# Patient Record
Sex: Female | Born: 1970 | Race: White | Hispanic: No | Smoking: Former smoker
Health system: Southern US, Community
[De-identification: ages and names within clinical notes are randomized; demographics above are authoritative.]

## PROBLEM LIST (undated history)

## (undated) DIAGNOSIS — E119 Type 2 diabetes mellitus without complications: Secondary | ICD-10-CM

## (undated) DIAGNOSIS — E039 Hypothyroidism, unspecified: Secondary | ICD-10-CM

## (undated) DIAGNOSIS — F329 Major depressive disorder, single episode, unspecified: Secondary | ICD-10-CM

## (undated) DIAGNOSIS — J45909 Unspecified asthma, uncomplicated: Secondary | ICD-10-CM

## (undated) DIAGNOSIS — F32A Depression, unspecified: Secondary | ICD-10-CM

## (undated) HISTORY — DX: Depression, unspecified: F32.A

## (undated) HISTORY — DX: Unspecified asthma, uncomplicated: J45.909

## (undated) HISTORY — DX: Hypothyroidism, unspecified: E03.9

## (undated) HISTORY — DX: Major depressive disorder, single episode, unspecified: F32.9

## (undated) HISTORY — DX: Type 2 diabetes mellitus without complications: E11.9

## (undated) HISTORY — PX: NO PAST SURGERIES: SHX2092

---

## 2000-04-10 ENCOUNTER — Ambulatory Visit (HOSPITAL_BASED_OUTPATIENT_CLINIC_OR_DEPARTMENT_OTHER): Admission: RE | Admit: 2000-04-10 | Discharge: 2000-04-10 | Payer: Self-pay | Admitting: Orthopedic Surgery

## 2000-05-22 ENCOUNTER — Ambulatory Visit (HOSPITAL_BASED_OUTPATIENT_CLINIC_OR_DEPARTMENT_OTHER): Admission: RE | Admit: 2000-05-22 | Discharge: 2000-05-22 | Payer: Self-pay | Admitting: Orthopedic Surgery

## 2005-06-22 ENCOUNTER — Ambulatory Visit: Payer: Self-pay | Admitting: Anesthesiology

## 2012-05-08 ENCOUNTER — Other Ambulatory Visit: Payer: Self-pay

## 2012-05-08 ENCOUNTER — Other Ambulatory Visit: Payer: Self-pay | Admitting: General Practice

## 2012-05-08 LAB — COMPREHENSIVE METABOLIC PANEL
Albumin: 3.5 g/dL (ref 3.4–5.0)
Alkaline Phosphatase: 126 U/L (ref 50–136)
Bilirubin,Total: 0.5 mg/dL (ref 0.2–1.0)
Co2: 26 mmol/L (ref 21–32)
Creatinine: 0.68 mg/dL (ref 0.60–1.30)
Glucose: 249 mg/dL — ABNORMAL HIGH (ref 65–99)
Osmolality: 278 (ref 275–301)
Sodium: 135 mmol/L — ABNORMAL LOW (ref 136–145)
Total Protein: 8.1 g/dL (ref 6.4–8.2)

## 2012-05-08 LAB — CBC WITH DIFFERENTIAL/PLATELET
Basophil %: 0.3 %
Eosinophil #: 0.1 10*3/uL (ref 0.0–0.7)
Eosinophil %: 2 %
HGB: 11.6 g/dL — ABNORMAL LOW (ref 12.0–16.0)
Lymphocyte %: 22.3 %
MCH: 26 pg (ref 26.0–34.0)
Neutrophil %: 65.1 %
RBC: 4.45 10*6/uL (ref 3.80–5.20)
WBC: 4.5 10*3/uL (ref 3.6–11.0)

## 2012-08-16 ENCOUNTER — Ambulatory Visit: Payer: Self-pay | Admitting: Hematology and Oncology

## 2012-08-16 LAB — CBC CANCER CENTER
Basophil #: 0 x10 3/mm (ref 0.0–0.1)
Basophil %: 0.4 %
Eosinophil #: 0.1 x10 3/mm (ref 0.0–0.7)
Eosinophil %: 2.1 %
HCT: 36 % (ref 35.0–47.0)
Lymphocyte %: 22.1 %
MCHC: 32.8 g/dL (ref 32.0–36.0)
Monocyte #: 0.4 x10 3/mm (ref 0.2–0.9)
RBC: 4.34 10*6/uL (ref 3.80–5.20)
RDW: 15.2 % — ABNORMAL HIGH (ref 11.5–14.5)
WBC: 4.3 x10 3/mm (ref 3.6–11.0)

## 2012-09-06 ENCOUNTER — Ambulatory Visit: Payer: Self-pay | Admitting: Hematology and Oncology

## 2012-09-06 LAB — CBC CANCER CENTER
Basophil %: 0.3 %
Eosinophil #: 0.1 x10 3/mm (ref 0.0–0.7)
Eosinophil %: 2 %
HCT: 35.1 % (ref 35.0–47.0)
HGB: 11.4 g/dL — ABNORMAL LOW (ref 12.0–16.0)
Lymphocyte %: 20.2 %
MCH: 27.3 pg (ref 26.0–34.0)
MCV: 84 fL (ref 80–100)
Monocyte #: 0.4 x10 3/mm (ref 0.2–0.9)
Neutrophil %: 67.7 %
RBC: 4.18 10*6/uL (ref 3.80–5.20)
WBC: 3.8 x10 3/mm (ref 3.6–11.0)

## 2012-09-06 LAB — TSH: Thyroid Stimulating Horm: 10.8 u[IU]/mL — ABNORMAL HIGH

## 2012-10-06 ENCOUNTER — Ambulatory Visit: Payer: Self-pay | Admitting: Hematology and Oncology

## 2012-10-10 ENCOUNTER — Ambulatory Visit: Payer: Self-pay | Admitting: Obstetrics and Gynecology

## 2012-10-10 LAB — BASIC METABOLIC PANEL
Anion Gap: 7 (ref 7–16)
Calcium, Total: 8.6 mg/dL (ref 8.5–10.1)
Co2: 23 mmol/L (ref 21–32)
EGFR (African American): >60
Osmolality: 281 (ref 275–301)
Sodium: 134 mmol/L — ABNORMAL LOW (ref 136–145)

## 2012-10-10 LAB — CBC
HCT: 34.3 % — ABNORMAL LOW (ref 35.0–47.0)
HGB: 11.8 g/dL — ABNORMAL LOW (ref 12.0–16.0)
MCH: 27.9 pg (ref 26.0–34.0)
MCHC: 34.3 g/dL (ref 32.0–36.0)
MCV: 81 fL (ref 80–100)
Platelet: 94 10*3/uL — ABNORMAL LOW (ref 150–440)
RBC: 4.22 10*6/uL (ref 3.80–5.20)

## 2012-10-14 ENCOUNTER — Ambulatory Visit: Payer: Self-pay | Admitting: Obstetrics and Gynecology

## 2013-04-15 ENCOUNTER — Other Ambulatory Visit: Payer: Self-pay | Admitting: Physician Assistant

## 2013-04-15 LAB — BASIC METABOLIC PANEL
BUN: 9 mg/dL (ref 7–18)
Calcium, Total: 8.5 mg/dL (ref 8.5–10.1)
Co2: 23 mmol/L (ref 21–32)
Creatinine: 0.57 mg/dL — ABNORMAL LOW (ref 0.60–1.30)
EGFR (African American): >60
EGFR (Non-African Amer.): >60
Potassium: 3.7 mmol/L (ref 3.5–5.1)
Sodium: 134 mmol/L — ABNORMAL LOW (ref 136–145)

## 2013-04-15 LAB — CBC WITH DIFFERENTIAL/PLATELET
Basophil #: 0 10*3/uL (ref 0.0–0.1)
Eosinophil #: 0.1 10*3/uL (ref 0.0–0.7)
Eosinophil %: 2.3 %
HCT: 35.4 % (ref 35.0–47.0)
Lymphocyte #: 0.8 10*3/uL — ABNORMAL LOW (ref 1.0–3.6)
MCH: 27.3 pg (ref 26.0–34.0)
Monocyte #: 0.3 x10 3/mm (ref 0.2–0.9)
Monocyte %: 8.3 %
Neutrophil #: 2.6 10*3/uL (ref 1.4–6.5)
Neutrophil %: 67.5 %
Platelet: 90 10*3/uL — ABNORMAL LOW (ref 150–440)
RBC: 4.46 10*6/uL (ref 3.80–5.20)
RDW: 15.7 % — ABNORMAL HIGH (ref 11.5–14.5)

## 2013-04-15 LAB — TSH: Thyroid Stimulating Horm: 13.5 u[IU]/mL — ABNORMAL HIGH

## 2013-07-14 ENCOUNTER — Encounter: Payer: Self-pay | Admitting: Internal Medicine

## 2013-07-14 ENCOUNTER — Ambulatory Visit (INDEPENDENT_AMBULATORY_CARE_PROVIDER_SITE_OTHER): Payer: 59 | Admitting: Internal Medicine

## 2013-07-14 VITALS — BP 110/70 | HR 86 | Temp 98.2°F | Ht 65.25 in | Wt 197.0 lb

## 2013-07-14 DIAGNOSIS — D696 Thrombocytopenia, unspecified: Secondary | ICD-10-CM

## 2013-07-14 DIAGNOSIS — N926 Irregular menstruation, unspecified: Secondary | ICD-10-CM

## 2013-07-14 DIAGNOSIS — E119 Type 2 diabetes mellitus without complications: Secondary | ICD-10-CM

## 2013-07-14 DIAGNOSIS — J45909 Unspecified asthma, uncomplicated: Secondary | ICD-10-CM

## 2013-07-14 DIAGNOSIS — D649 Anemia, unspecified: Secondary | ICD-10-CM

## 2013-07-14 DIAGNOSIS — E039 Hypothyroidism, unspecified: Secondary | ICD-10-CM

## 2013-07-14 DIAGNOSIS — F329 Major depressive disorder, single episode, unspecified: Secondary | ICD-10-CM

## 2013-07-16 ENCOUNTER — Encounter: Payer: Self-pay | Admitting: Internal Medicine

## 2013-07-16 DIAGNOSIS — J45909 Unspecified asthma, uncomplicated: Secondary | ICD-10-CM | POA: Insufficient documentation

## 2013-07-16 DIAGNOSIS — E039 Hypothyroidism, unspecified: Secondary | ICD-10-CM | POA: Insufficient documentation

## 2013-07-16 DIAGNOSIS — D696 Thrombocytopenia, unspecified: Secondary | ICD-10-CM | POA: Insufficient documentation

## 2013-07-16 DIAGNOSIS — D649 Anemia, unspecified: Secondary | ICD-10-CM | POA: Insufficient documentation

## 2013-07-16 DIAGNOSIS — F329 Major depressive disorder, single episode, unspecified: Secondary | ICD-10-CM | POA: Insufficient documentation

## 2013-07-16 DIAGNOSIS — E119 Type 2 diabetes mellitus without complications: Secondary | ICD-10-CM | POA: Insufficient documentation

## 2013-07-16 DIAGNOSIS — N926 Irregular menstruation, unspecified: Secondary | ICD-10-CM | POA: Insufficient documentation

## 2013-07-16 NOTE — Assessment & Plan Note (Signed)
On sertraline.  Stable.  Follow.  

## 2013-07-16 NOTE — Assessment & Plan Note (Signed)
Was followed at the [facility].  Per her report, counts had improved.  Follow.

## 2013-07-16 NOTE — Assessment & Plan Note (Signed)
On levothyroxine.  Follow tsh.  

## 2013-07-16 NOTE — Assessment & Plan Note (Addendum)
On metformin.  Discussed need for diet and exercise.  Check metabolic panel and a1c.  Needs eye exam.  States has never had one.  On losartan for renal protection.

## 2013-07-16 NOTE — Assessment & Plan Note (Signed)
Saw GYN.  S/p ablation.  Doing better.  Continue to follow up with Dr Greggory Keen.

## 2013-07-16 NOTE — Assessment & Plan Note (Signed)
Was followed at the [facility].  Per her report, counts had improved.  Recheck cbc.

## 2013-07-16 NOTE — Progress Notes (Signed)
Subjective:    Patient ID: Diane Burnett, female    DOB: [birth year], 42 y.o.   MRN: [id number]  HPI 42 year old female with past history of diabetes, depression and hypothyroidism who comes in today to follow up on these issues as well as to establish care.  She has recently been followed at [facility]).   Is also seeing Dr Greggory Keen.  Previously saw Dr Bethena Midget [facility]) and Dr Jerral Ralph.  Works in [employer] at [employer].  On metformin for her diabetes.  Has not been watching what she eats.  Not exercising.  Has gained 10-15 pounds in the past year.  Was followed at the [facility] for decreased platelet count and anemia (9-10/13).  Had increased menstrual bleeding.  Is s/p Novasure ablation.  Has fibroids and ovarian cysts.  Now periods are occurring every month.  Counts are better.  Does report some fatigue.  Bowels stable.     Past Medical History  Diagnosis Date  . Asthma   . Depression   . Diabetes mellitus without complication   . Hypothyroidism     Outpatient Encounter Prescriptions as of 07/14/2013  Medication Sig Dispense Refill  . CINNAMON PO Take by mouth.      . cyclobenzaprine (FLEXERIL) 10 MG tablet Take 10 mg by mouth 3 (three) times daily as needed for muscle spasms.      . Fish Oil-Cholecalciferol (FISH OIL + D3 PO) Take by mouth.      . levothyroxine (SYNTHROID, LEVOTHROID) 125 MCG tablet Take 125 mcg by mouth daily before breakfast.      . losartan (COZAAR) 25 MG tablet Take 25 mg by mouth daily.      . meloxicam (MOBIC) 15 MG tablet Take 15 mg by mouth daily.      . metFORMIN (GLUCOPHAGE) 1000 MG tablet Take 1,000 mg by mouth 2 (two) times daily with a meal.      . sertraline (ZOLOFT) 100 MG tablet Take 100 mg by mouth daily.       No facility-administered encounter medications on file as of 07/14/2013.    Review of Systems Patient denies any headache, lightheadedness or dizziness.  No sinus or allergy symptoms.  No chest pain, tightness or  palpitations.  No increased shortness of breath, cough or congestion.  No nausea or vomiting.  No acid reflux.  No abdominal pain or cramping.  No bowel change, such as diarrhea, constipation, BRBPR or melana.  No urine change.   Periods better.  Has a known enlarged thyroid.       Objective:   Physical Exam Filed Vitals:   07/14/13 1433  BP: 110/70  Pulse: 86  Temp: 98.2 F (53.8 C)   42 year old female in no acute distress.   HEENT:  Nares- clear.  Oropharynx - without lesions. NECK:  Supple.  Nontender.  No audible bruit.  HEART:  Appears to be regular. LUNGS:  No crackles or wheezing audible.  Respirations even and unlabored.  RADIAL PULSE:  Equal bilaterally.    BREASTS: performed by gyn.  ABDOMEN:  Soft, nontender.  Bowel sounds present and normal.  No audible abdominal bruit.  GU:  Performed by gyn.   EXTREMITIES:  No increased edema present.  DP pulses palpable and equal bilaterally.          Assessment & Plan:  FATIGUE.  Check cbc, met c and ts.   EALTH MAINTENANCE.  Review records from outside.  Keep up to date  with pelvic/paps and mammograms.    I spent 45 minutes with the patient and more than 50% of the time was spent in consultation regarding the above.

## 2013-07-16 NOTE — Assessment & Plan Note (Signed)
Previously smoked.  Quit 2012.  Breathing stable.   

## 2013-08-01 ENCOUNTER — Encounter: Payer: Self-pay | Admitting: Internal Medicine

## 2013-08-13 ENCOUNTER — Other Ambulatory Visit: Payer: Self-pay | Admitting: *Deleted

## 2013-08-13 MED ORDER — METFORMIN HCL 1000 MG PO TABS: 1000.0000 mg | ORAL_TABLET | Freq: Two times a day (BID) | ORAL | Status: AC

## 2013-09-16 ENCOUNTER — Ambulatory Visit (INDEPENDENT_AMBULATORY_CARE_PROVIDER_SITE_OTHER): Payer: 59 | Admitting: Internal Medicine

## 2013-09-16 ENCOUNTER — Encounter: Payer: Self-pay | Admitting: Internal Medicine

## 2013-09-16 VITALS — BP 120/70 | HR 87 | Temp 98.1°F | Ht 65.25 in | Wt 194.8 lb

## 2013-09-16 DIAGNOSIS — E039 Hypothyroidism, unspecified: Secondary | ICD-10-CM

## 2013-09-16 DIAGNOSIS — E119 Type 2 diabetes mellitus without complications: Secondary | ICD-10-CM

## 2013-09-16 DIAGNOSIS — D696 Thrombocytopenia, unspecified: Secondary | ICD-10-CM

## 2013-09-16 DIAGNOSIS — J45909 Unspecified asthma, uncomplicated: Secondary | ICD-10-CM

## 2013-09-16 DIAGNOSIS — N926 Irregular menstruation, unspecified: Secondary | ICD-10-CM

## 2013-09-16 DIAGNOSIS — D649 Anemia, unspecified: Secondary | ICD-10-CM

## 2013-09-16 DIAGNOSIS — F329 Major depressive disorder, single episode, unspecified: Secondary | ICD-10-CM

## 2013-09-16 NOTE — Assessment & Plan Note (Signed)
On sertraline.  Stable.  Follow.  

## 2013-09-16 NOTE — Assessment & Plan Note (Signed)
Saw GYN.  S/p ablation.  Doing better.  Continue to follow up with Dr Greggory Keen.

## 2013-09-16 NOTE — Assessment & Plan Note (Signed)
Previously smoked.  Quit 2012.  Breathing stable.   

## 2013-09-16 NOTE — Assessment & Plan Note (Addendum)
Was followed at the [facility].  Per her report, counts had improved.  Recheck cbc.  She cannot afford further lab testing.  See note.

## 2013-09-16 NOTE — Assessment & Plan Note (Addendum)
On levothyroxine.  Follow tsh.  She had her dose adjusted over the summer, but reports she cannot afford to have it rechecked.  See note.

## 2013-09-16 NOTE — Progress Notes (Signed)
Pre-visit discussion using our clinic review tool. No additional management support is needed unless otherwise documented below in the visit note.  

## 2013-09-16 NOTE — Assessment & Plan Note (Addendum)
On metformin.  Discussed need for diet and exercise.  Check metabolic panel and a1c.  Needs eye exam.  States has never had one.  On losartan for renal protection.  Unable to afford labs.  See note.

## 2013-09-16 NOTE — Progress Notes (Signed)
Subjective:    Patient ID: Diane Burnett, female    DOB: 30-May-1971, 41 y.o.   MRN: [id number]  HPI 42 year old female with past history of diabetes, depression and hypothyroidism who comes in today for a scheduled follow up.   Works in [employer] at [employer].  On metformin for her diabetes.  Has not been watching what she eats.  Not exercising.  Has gained 10-15 pounds in the past year.  Was followed at the [facility] for decreased platelet count and anemia (9-10/13).  Had increased menstrual bleeding.  Is s/p Novasure ablation.  Has fibroids and ovarian cysts.  Now periods are occurring every month.  She did not get any labs checked since her previous visit.  States she cannot afford to get any labs drawn.  Unable to afford further testing and w/up.  Unable to afford increased medications.  We discussed this at length.  Discussed the need to have regular labs to be able to manage her diabetes and other medical problems.  We also discussed the need for further testing regarding her platelets, etc.  She again expressed that she is unable to afford labs and testing.  We discussed other options including [facility] and [facility] - to help with her financial situation.       Past Medical History  Diagnosis Date  . Asthma   . Depression   . Diabetes mellitus without complication   . Hypothyroidism     Outpatient Encounter Prescriptions as of 09/16/2013  Medication Sig  . CINNAMON PO Take by mouth.  . levothyroxine (SYNTHROID, LEVOTHROID) 125 MCG tablet Take 125 mcg by mouth daily before breakfast.  . loratadine (CLARITIN) 10 MG tablet Take 10 mg by mouth daily.  Marland Kitchen losartan (COZAAR) 25 MG tablet Take 25 mg by mouth daily.  . meloxicam (MOBIC) 15 MG tablet Take 15 mg by mouth daily.  . metFORMIN (GLUCOPHAGE) 1000 MG tablet Take 1 tablet (1,000 mg total) by mouth 2 (two) times daily with a meal.  . sertraline (ZOLOFT) 100 MG tablet Take 100 mg by mouth daily.  . [DISCONTINUED]  cyclobenzaprine (FLEXERIL) 10 MG tablet Take 10 mg by mouth 3 (three) times daily as needed for muscle spasms.  . [DISCONTINUED] Fish Oil-Cholecalciferol (FISH OIL + D3 PO) Take by mouth.    Review of Systems Patient denies any headache, lightheadedness or dizziness reported.   No sinus or allergy symptoms.  No chest pain, tightness or palpitations reported.   No increased shortness of breath, cough or congestion.  No nausea or vomiting.  No acid reflux.  No abdominal pain or cramping.  No bowel change reported.   No urine change.   Periods better.  Has a known enlarged thyroid.       Objective:   Physical Exam  Filed Vitals:   09/16/13 1421  BP: 120/70  Pulse: 87  Temp: 98.1 F (39.18 C)   42 year old female in no acute distress.   Did not perform physical exam.           Assessment & Plan:  HEALTH MAINTENANCE.  Will need to keep up to date with pelvic/paps and mammograms.    DISPOSITION.  She is unable to have labs and further testing secondary to financial concerns.  We discussed this at length.  Discussed my concern about not being able to care for her effectively.  I gave her the information about [facility] and [facility].  She was in  agreement to call them for further care.  Will notify me if problems.  She was comfortable with this plan.

## 2013-09-16 NOTE — Assessment & Plan Note (Addendum)
Was followed at the [facility].  Per her report, counts had improved.  Follow.  Unable to afford labs.

## 2013-09-20 ENCOUNTER — Encounter: Payer: Self-pay | Admitting: Internal Medicine

## 2013-09-24 ENCOUNTER — Telehealth: Payer: Self-pay | Admitting: Internal Medicine

## 2013-09-24 NOTE — Telephone Encounter (Signed)
Asking for referral to [facility] for diabetic classes.  Raynelle Fanning at the wellness program told pt once she is referred the wellness program would pay for A1c four times a year.  Pt asking if she has to get the A1c done at the hospital or if she can get it done at the office here, does not want to be charged.

## 2013-09-24 NOTE — Telephone Encounter (Signed)
See her attached message.  I would like to refer her over to the "Wellness Program".  I don't know if there is a special form to complete.  Can we get her referred?  Also, if able, please let her know that Raynelle Fanning probably would be able to tell her better (regarding her question about the a1c) , but I would think it would need to be done at the hospital.  She can let me know.

## 2013-09-24 NOTE — Telephone Encounter (Signed)
I'm assuming that it is the diabetic education classes. Referral has been filled out and placed in your folder to be signed.

## 2013-09-26 ENCOUNTER — Ambulatory Visit (INDEPENDENT_AMBULATORY_CARE_PROVIDER_SITE_OTHER): Payer: 59 | Admitting: Internal Medicine

## 2013-09-26 ENCOUNTER — Encounter (INDEPENDENT_AMBULATORY_CARE_PROVIDER_SITE_OTHER): Payer: Self-pay

## 2013-09-26 ENCOUNTER — Encounter: Payer: Self-pay | Admitting: Internal Medicine

## 2013-09-26 VITALS — BP 110/78 | HR 81 | Temp 98.2°F | Ht 65.25 in | Wt 197.5 lb

## 2013-09-26 DIAGNOSIS — R109 Unspecified abdominal pain: Secondary | ICD-10-CM

## 2013-09-26 DIAGNOSIS — F3289 Other specified depressive episodes: Secondary | ICD-10-CM

## 2013-09-26 DIAGNOSIS — F329 Major depressive disorder, single episode, unspecified: Secondary | ICD-10-CM

## 2013-09-26 DIAGNOSIS — N926 Irregular menstruation, unspecified: Secondary | ICD-10-CM

## 2013-09-26 DIAGNOSIS — D696 Thrombocytopenia, unspecified: Secondary | ICD-10-CM

## 2013-09-26 DIAGNOSIS — D649 Anemia, unspecified: Secondary | ICD-10-CM

## 2013-09-26 DIAGNOSIS — J45909 Unspecified asthma, uncomplicated: Secondary | ICD-10-CM

## 2013-09-26 DIAGNOSIS — J329 Chronic sinusitis, unspecified: Secondary | ICD-10-CM

## 2013-09-26 DIAGNOSIS — E039 Hypothyroidism, unspecified: Secondary | ICD-10-CM

## 2013-09-26 DIAGNOSIS — E119 Type 2 diabetes mellitus without complications: Secondary | ICD-10-CM

## 2013-09-26 LAB — CBC WITH DIFFERENTIAL/PLATELET
Basophils Absolute: 0 10*3/uL (ref 0.0–0.1)
Eosinophils Absolute: 0 10*3/uL (ref 0.0–0.7)
Eosinophils Relative: 0.7 % (ref 0.0–5.0)
HCT: 38 % (ref 36.0–46.0)
Hemoglobin: 13.1 g/dL (ref 12.0–15.0)
Lymphs Abs: 0.5 10*3/uL — ABNORMAL LOW (ref 0.7–4.0)
MCV: 84.3 fl (ref 78.0–100.0)
Monocytes Absolute: 0.5 10*3/uL (ref 0.1–1.0)
Monocytes Relative: 14 % — ABNORMAL HIGH (ref 3.0–12.0)
Neutro Abs: 2.2 10*3/uL (ref 1.4–7.7)
RDW: 15.6 % — ABNORMAL HIGH (ref 11.5–14.6)
WBC: 3.2 10*3/uL — ABNORMAL LOW (ref 4.5–10.5)

## 2013-09-26 LAB — HEPATIC FUNCTION PANEL
AST: 29 U/L (ref 0–37)
Albumin: 3.7 g/dL (ref 3.5–5.2)
Alkaline Phosphatase: 99 U/L (ref 39–117)
Bilirubin, Direct: 0.4 mg/dL — ABNORMAL HIGH (ref 0.0–0.3)
Total Bilirubin: 2.9 mg/dL — ABNORMAL HIGH (ref 0.3–1.2)

## 2013-09-26 LAB — BASIC METABOLIC PANEL
BUN: 10 mg/dL (ref 6–23)
Calcium: 9 mg/dL (ref 8.4–10.5)
Creatinine, Ser: 0.5 mg/dL (ref 0.4–1.2)
GFR: 147.23 mL/min (ref >60.00)
Glucose, Bld: 317 mg/dL — ABNORMAL HIGH (ref 70–99)
Potassium: 3.8 mEq/L (ref 3.5–5.1)
Sodium: 130 mEq/L — ABNORMAL LOW (ref 135–145)

## 2013-09-26 MED ORDER — AZITHROMYCIN 250 MG PO TABS: ORAL_TABLET | ORAL | Status: DC

## 2013-09-26 NOTE — Progress Notes (Signed)
Pre-visit discussion using our clinic review tool. No additional management support is needed unless otherwise documented below in the visit note.  

## 2013-09-28 ENCOUNTER — Encounter: Payer: Self-pay | Admitting: Internal Medicine

## 2013-09-28 DIAGNOSIS — J329 Chronic sinusitis, unspecified: Secondary | ICD-10-CM | POA: Insufficient documentation

## 2013-09-28 DIAGNOSIS — R109 Unspecified abdominal pain: Secondary | ICD-10-CM | POA: Insufficient documentation

## 2013-09-28 NOTE — Assessment & Plan Note (Addendum)
Was followed at the [facility]  Per her report, counts had improved.  Recheck cbc.  Appears to have a palpable enlarged spleen on exam.  Check abdominal ultrasound.

## 2013-09-28 NOTE — Assessment & Plan Note (Signed)
On metformin.  Discussed need for diet and exercise.  Check metabolic panel and a1c.  Needs eye exam.  States has never had one.  On losartan for renal protection.

## 2013-09-28 NOTE — Assessment & Plan Note (Signed)
Was followed at the [facility].  Per her report, counts had improved.  Follow.  Recheck cbc.  Obtain records from the cancer center.

## 2013-09-28 NOTE — Progress Notes (Signed)
Subjective:    Patient ID: Diane Burnett, female    DOB: 07-27-1971, 42 y.o.   MRN: [id number]  Sore Throat   42 year old female with past history of diabetes, depression and hypothyroidism who comes in today as a work in with concerns regarding some increased sinus pressure and congestion.  She also reports some increased bloating and right lower quadrant pain.   Works in [employer] at [employer].  On metformin for her diabetes.  Has not been watching what she eats.  Not exercising.  Has gained 10-15 pounds in the past year.  Was followed at the [facility] for decreased platelet count and anemia (9-10/13).  Had increased menstrual bleeding.  Is s/p Novasure ablation.  Has fibroids and ovarian cysts.  Now periods are occurring every month.  She has had financial issues.  Had not been able to get labs drawn.  She does report some right lower quadrant discomfort.  Intermittent.  Periods occurring every two weeks.  Light.  Only last for a few days.  Also reports increased sinus pressure and congestion.  Some right ear discomfort.  No chest congestion.  No sob.  Describes some bloating and discomfort in her abdomen.  Some nausea - occurs after she eats.  Bowels ok.        Past Medical History  Diagnosis Date  . Asthma   . Depression   . Diabetes mellitus without complication   . Hypothyroidism     Outpatient Encounter Prescriptions as of 09/26/2013  Medication Sig  . CINNAMON PO Take by mouth.  . levothyroxine (SYNTHROID, LEVOTHROID) 125 MCG tablet Take 125 mcg by mouth daily before breakfast.  . loratadine (CLARITIN) 10 MG tablet Take 10 mg by mouth daily.  Marland Kitchen losartan (COZAAR) 25 MG tablet Take 25 mg by mouth daily.  . meloxicam (MOBIC) 15 MG tablet Take 15 mg by mouth daily.  . metFORMIN (GLUCOPHAGE) 1000 MG tablet Take 1 tablet (1,000 mg total) by mouth 2 (two) times daily with a meal.  . sertraline (ZOLOFT) 100 MG tablet Take 100 mg by mouth daily.  Marland Kitchen azithromycin (ZITHROMAX) 250 MG  tablet Take two tablets x 1 day and then one per day for four more days.    Review of Systems Patient denies any headache, lightheadedness or dizziness reported.   No sinus or allergy symptoms.  No chest pain, tightness or palpitations reported.   No increased shortness of breath.  Does report the congestion as outlined.    No vomiting.  Some nausea as outlined.  No acid reflux.  Abdominal discomfort as outlined.   No bowel change reported.   No urine change.   Periods every two weeks.   Has a known enlarged thyroid.       Objective:   Physical Exam  Filed Vitals:   09/26/13 1012  BP: 110/78  Pulse: 81  Temp: 98.2 F (94.39 C)   42 year old female in no acute distress.   HEENT:  Nares- clear - slightly erythematous turbinates.   Oropharynx - without lesions.  TMs visualized without erythema. NECK:  Supple.  Nontender.  No audible bruit.  HEART:  Appears to be regular. LUNGS:  No crackles or wheezing audible.  Respirations even and unlabored.  RADIAL PULSE:  Equal bilaterally.  ABDOMEN:  Soft, nontender.  Bowel sounds present and normal.  No audible abdominal bruit.  Appears to have a palpable enlarged spleen.   EXTREMITIES:  No increased edema present.  DP pulses palpable  and equal bilaterally.          Assessment & Plan:  HEALTH MAINTENANCE.  Will need to keep up to date with pelvic exams and pap smears.  Keep on track with mammograms.

## 2013-09-28 NOTE — Assessment & Plan Note (Signed)
On sertraline.  Stable.  Follow.  

## 2013-09-28 NOTE — Assessment & Plan Note (Signed)
Saline nasal flushes and flonase nasal flushes as directed.  zpak as directed.  Follow.

## 2013-09-28 NOTE — Assessment & Plan Note (Signed)
On levothyroxine.  Follow tsh.  She had her dose adjusted over the summer, but reports she could not afford to have it rechecked.  Agreed to labs.  Check tsh.

## 2013-09-28 NOTE — Assessment & Plan Note (Signed)
Increased discomfort and nausea noted after eating.  Treat with nexium.  Check abdominal ultrasound.  Further w/up pending.  Check liver panel.

## 2013-09-28 NOTE — Assessment & Plan Note (Signed)
Previously smoked.  Quit 2012.  Breathing stable.   

## 2013-09-28 NOTE — Assessment & Plan Note (Signed)
Saw GYN.  S/p ablation.  Periods as outlined.  Given the bleeding every two weeks, will refer back to gyn for evaluation.

## 2013-10-01 ENCOUNTER — Ambulatory Visit: Payer: Self-pay | Admitting: Internal Medicine

## 2013-10-01 ENCOUNTER — Other Ambulatory Visit: Payer: Self-pay | Admitting: Internal Medicine

## 2013-10-01 MED ORDER — LEVOTHYROXINE SODIUM 150 MCG PO TABS: 150.0000 ug | ORAL_TABLET | Freq: Every day | ORAL | Status: DC

## 2013-10-01 NOTE — Progress Notes (Signed)
rx sent if for her thyroid medication.  Changed to .

## 2013-10-10 ENCOUNTER — Ambulatory Visit: Payer: Self-pay | Admitting: Internal Medicine

## 2013-10-10 ENCOUNTER — Telehealth: Payer: Self-pay | Admitting: Internal Medicine

## 2013-10-10 DIAGNOSIS — D696 Thrombocytopenia, unspecified: Secondary | ICD-10-CM

## 2013-10-10 NOTE — Telephone Encounter (Signed)
Pt was notified of lab results and need for hematology referral.  Order placed for referral.

## 2013-10-14 ENCOUNTER — Telehealth: Payer: Self-pay | Admitting: Internal Medicine

## 2013-10-14 NOTE — Telephone Encounter (Signed)
Note made in referral.

## 2013-10-14 NOTE — Telephone Encounter (Signed)
Called to inform pt was a no-show for appt at [facility] scheduled 12/9.

## 2013-10-14 NOTE — Telephone Encounter (Signed)
Diane Burnett, is this the pt that you left detailed message about her appt.  If so, please call her and let her know she missed her appt and needs to call them to reschedule.  Thanks.

## 2013-10-14 NOTE — Telephone Encounter (Signed)
I believe this is the patient that was not aware of her appt.

## 2013-10-15 ENCOUNTER — Encounter: Payer: Self-pay | Admitting: *Deleted

## 2013-10-15 DIAGNOSIS — R161 Splenomegaly, not elsewhere classified: Secondary | ICD-10-CM

## 2013-10-15 NOTE — Telephone Encounter (Signed)
Please see her my chart message.  She needs a GI appt.  Order placed for a GI referral.  She missed her hematology appt.  She was out of town with a sick friend.  Got her dates mixed up.  Can we reschedule or notify her to reschedule.  Thanks.

## 2013-10-16 ENCOUNTER — Encounter: Payer: Self-pay | Admitting: Emergency Medicine

## 2013-10-21 NOTE — Telephone Encounter (Signed)
Per notes on Referral Amber gave patients instructions on rescheduling appointment on 10/16/13.

## 2013-10-31 ENCOUNTER — Encounter: Payer: Self-pay | Admitting: Internal Medicine

## 2013-11-06 ENCOUNTER — Ambulatory Visit: Payer: Self-pay | Admitting: Internal Medicine

## 2013-11-10 ENCOUNTER — Encounter: Payer: Self-pay | Admitting: Emergency Medicine

## 2013-11-25 ENCOUNTER — Ambulatory Visit (INDEPENDENT_AMBULATORY_CARE_PROVIDER_SITE_OTHER): Payer: 59 | Admitting: Internal Medicine

## 2013-11-25 ENCOUNTER — Encounter: Payer: Self-pay | Admitting: Internal Medicine

## 2013-11-25 VITALS — BP 120/80 | HR 86 | Temp 98.2°F | Ht 65.25 in | Wt 196.5 lb

## 2013-11-25 DIAGNOSIS — J45909 Unspecified asthma, uncomplicated: Secondary | ICD-10-CM

## 2013-11-25 DIAGNOSIS — M2550 Pain in unspecified joint: Secondary | ICD-10-CM

## 2013-11-25 DIAGNOSIS — D649 Anemia, unspecified: Secondary | ICD-10-CM

## 2013-11-25 DIAGNOSIS — F329 Major depressive disorder, single episode, unspecified: Secondary | ICD-10-CM

## 2013-11-25 DIAGNOSIS — E039 Hypothyroidism, unspecified: Secondary | ICD-10-CM

## 2013-11-25 DIAGNOSIS — F3289 Other specified depressive episodes: Secondary | ICD-10-CM

## 2013-11-25 DIAGNOSIS — D696 Thrombocytopenia, unspecified: Secondary | ICD-10-CM

## 2013-11-25 DIAGNOSIS — R109 Unspecified abdominal pain: Secondary | ICD-10-CM

## 2013-11-25 DIAGNOSIS — F32A Depression, unspecified: Secondary | ICD-10-CM

## 2013-11-25 DIAGNOSIS — E119 Type 2 diabetes mellitus without complications: Secondary | ICD-10-CM

## 2013-11-25 DIAGNOSIS — N926 Irregular menstruation, unspecified: Secondary | ICD-10-CM

## 2013-11-25 MED ORDER — GLIPIZIDE ER 5 MG PO TB24: 5.0000 mg | ORAL_TABLET | Freq: Every day | ORAL | Status: DC

## 2013-11-25 NOTE — Progress Notes (Signed)
Pre-visit discussion using our clinic review tool. No additional management support is needed unless otherwise documented below in the visit note.  

## 2013-11-29 ENCOUNTER — Encounter: Payer: Self-pay | Admitting: Internal Medicine

## 2013-11-29 DIAGNOSIS — M2550 Pain in unspecified joint: Secondary | ICD-10-CM | POA: Insufficient documentation

## 2013-11-29 NOTE — Progress Notes (Signed)
Subjective:    Patient ID: Diane Burnett, female    DOB: May 10, 1971, 43 y.o.   MRN: [id number]  HPI 43 year old female with past history of diabetes, depression and hypothyroidism who comes in today for a scheduled follow up.   Works in [employer] at [employer].  On metformin for her diabetes.  Has not been watching what she eats.  Not exercising. Occasionally checking her sugars.  Elevated.  AM sugars 180-250 and one pm sugar 280.    Was followed at the [facility] for decreased platelet count and anemia (9-10/13).  Had increased menstrual bleeding.  Is s/p Novasure ablation.  Has fibroids and ovarian cysts.  Recent w/up included abnormal labs (thrombocytopenia, leukopenia and abnormal liver function tests).  Abnormal abdominal ultrasound.  She is now being followed at [facility].  W/up in progress.  Being followed at [facility].  ANA positive.  Apparently seeing a liver specialist.  She is off zoloft.  Increased stress.  Having increased stiffness in her hands, shoulders and knees, etc.  Increased fatigue.     Past Medical History  Diagnosis Date  . Asthma   . Depression   . Diabetes mellitus without complication   . Hypothyroidism     Outpatient Encounter Prescriptions as of 11/25/2013  Medication Sig  . CINNAMON PO Take by mouth.  . levothyroxine (SYNTHROID) 150 MCG tablet Take 1 tablet (150 mcg total) by mouth daily before breakfast.  . loratadine (CLARITIN) 10 MG tablet Take 10 mg by mouth daily.  Marland Kitchen losartan (COZAAR) 25 MG tablet Take 25 mg by mouth daily.  . metFORMIN (GLUCOPHAGE) 1000 MG tablet Take 1 tablet (1,000 mg total) by mouth 2 (two) times daily with a meal.  . sertraline (ZOLOFT) 100 MG tablet Take 100 mg by mouth daily.  Marland Kitchen glipiZIDE (GLUCOTROL XL) 5 MG 24 hr tablet Take 1 tablet (5 mg total) by mouth daily with breakfast.  . meloxicam (MOBIC) 15 MG tablet Take 15 mg by mouth daily.  . [DISCONTINUED] azithromycin (ZITHROMAX) 250 MG tablet Take two tablets x 1 day and then one per day for  four more days.    Review of Systems Patient denies any headache, lightheadedness or dizziness reported.   No sinus or allergy symptoms.  No chest pain, tightness or palpitations reported.   No increased shortness of breath, cough or congestion.  No nausea or vomiting.  No acid reflux.  No abdominal pain or cramping.  No bowel change reported.   No urine change.  Has a known enlarged thyroid.  Increased joint stiffness.  Leukopenia and thrombocytopenia and abnormal liver function tests.  Undergoing w/up at [facility].  Going to [facility].  Not watching her diet.  Not exercising.  Sugars as outlined.       Objective:   Physical Exam  Filed Vitals:   11/25/13 1130  BP: 120/80  Pulse: 86  Temp: 98.2 F (66.33 C)   43 year old female in no acute distress.   HEENT:  Nares- clear.  Oropharynx - without lesions. NECK:  Supple.  Minimal tenderness to palpation.  No audible bruit.  Enlarged spleen.   HEART:  Appears to be regular. LUNGS:  No crackles or wheezing audible.  Respirations even and unlabored.  RADIAL PULSE:  Equal bilaterally.   ABDOMEN:  Soft, nontender.  Bowel sounds present and normal.  No audible abdominal bruit.   EXTREMITIES:  No increased edema present.  DP pulses palpable and equal bilaterally.   FEET:  No lesions.  Assessment & Plan:  HEALTH MAINTENANCE.  Will need to keep up to date with pelvic/paps and mammograms.    I spent more than 25 minutes with the patient and more than 50% of the time was spent in consultation regarding the above.

## 2013-11-29 NOTE — Assessment & Plan Note (Signed)
Joint pains as outlined.  W/up underway.  Obtain records.  ANA positive.  Being referred to rheumatology.

## 2013-11-29 NOTE — Assessment & Plan Note (Signed)
Off sertraline.  Increased stress.  Needs to restart.

## 2013-11-29 NOTE — Assessment & Plan Note (Signed)
Last hgb wnl.  

## 2013-11-29 NOTE — Assessment & Plan Note (Signed)
Had abdominal ultrasound.  Abnormal.  Seeing GI.  She mentioned concern about cirrhosis.  Has splenomegaly.  Obtain records of w/up.

## 2013-11-29 NOTE — Assessment & Plan Note (Signed)
Saw GYN.  S/p ablation.

## 2013-11-29 NOTE — Assessment & Plan Note (Signed)
On levothyroxine.  Just adjusted the synthroid dose.  Needs a f/u tsh.  Had an increased amount of labs drawn.  Need to obtain results.  May need to adjust her synthroid more.

## 2013-11-29 NOTE — Assessment & Plan Note (Signed)
On metformin.  Discussed need for diet and exercise.  Needs eye exam.  States has never had one.  On losartan for renal protection.  Has not had an a1c.  Plans to get this drawn at the hospital.  Start glucotrol XL 5mg  q day.  Follow sugars.  Get her back in soon to reassess.

## 2013-11-29 NOTE — Assessment & Plan Note (Signed)
Found to have thrombocytopenia and leukopenia.  Referred to the cancer center.  Being followed and worked up at [facility].  Labs followed there.  W/up there.  Obtain records.

## 2013-11-29 NOTE — Assessment & Plan Note (Signed)
Previously smoked.  Quit 2012.  Breathing stable.

## 2013-12-07 ENCOUNTER — Ambulatory Visit: Payer: Self-pay | Admitting: Internal Medicine

## 2013-12-11 ENCOUNTER — Encounter: Payer: Self-pay | Admitting: Internal Medicine

## 2013-12-18 ENCOUNTER — Encounter: Payer: Self-pay | Admitting: Internal Medicine

## 2013-12-19 NOTE — Telephone Encounter (Signed)
Will notify when ready

## 2013-12-22 ENCOUNTER — Encounter: Payer: Self-pay | Admitting: *Deleted

## 2013-12-22 ENCOUNTER — Telehealth: Payer: Self-pay | Admitting: Internal Medicine

## 2013-12-22 NOTE — Telephone Encounter (Signed)
The patient called wanting to know if her FMLA paper work was finished she has to have it turned in by 2.20.15

## 2013-12-22 NOTE — Telephone Encounter (Signed)
Notify pt that I will have this completed by tomorrow and we will call her when to pick up.  Thanks.

## 2013-12-22 NOTE — Telephone Encounter (Signed)
Sent mychart message

## 2013-12-24 ENCOUNTER — Telehealth: Payer: Self-pay | Admitting: Internal Medicine

## 2013-12-24 MED ORDER — GABAPENTIN 100 MG PO CAPS: 100.0000 mg | ORAL_CAPSULE | Freq: Three times a day (TID) | ORAL | Status: AC

## 2013-12-24 NOTE — Telephone Encounter (Signed)
Through pt questionnaire, will start gabapentin 100mg  tid.  Will monitor for side effects.  Instructed not to take with other sedating medications, alcohol, etc.  GI ok with starting.

## 2014-01-14 ENCOUNTER — Ambulatory Visit: Payer: Self-pay | Admitting: Internal Medicine

## 2014-01-15 ENCOUNTER — Ambulatory Visit (INDEPENDENT_AMBULATORY_CARE_PROVIDER_SITE_OTHER): Payer: 59 | Admitting: Internal Medicine

## 2014-01-15 ENCOUNTER — Encounter: Payer: Self-pay | Admitting: Internal Medicine

## 2014-01-15 VITALS — BP 110/60 | HR 75 | Temp 98.1°F | Ht 65.25 in | Wt 197.2 lb

## 2014-01-15 DIAGNOSIS — J45909 Unspecified asthma, uncomplicated: Secondary | ICD-10-CM

## 2014-01-15 DIAGNOSIS — F32A Depression, unspecified: Secondary | ICD-10-CM

## 2014-01-15 DIAGNOSIS — E119 Type 2 diabetes mellitus without complications: Secondary | ICD-10-CM

## 2014-01-15 DIAGNOSIS — M2550 Pain in unspecified joint: Secondary | ICD-10-CM

## 2014-01-15 DIAGNOSIS — D696 Thrombocytopenia, unspecified: Secondary | ICD-10-CM

## 2014-01-15 DIAGNOSIS — F3289 Other specified depressive episodes: Secondary | ICD-10-CM

## 2014-01-15 DIAGNOSIS — E039 Hypothyroidism, unspecified: Secondary | ICD-10-CM

## 2014-01-15 DIAGNOSIS — D649 Anemia, unspecified: Secondary | ICD-10-CM

## 2014-01-15 DIAGNOSIS — R161 Splenomegaly, not elsewhere classified: Secondary | ICD-10-CM

## 2014-01-15 DIAGNOSIS — F329 Major depressive disorder, single episode, unspecified: Secondary | ICD-10-CM

## 2014-01-15 NOTE — Progress Notes (Signed)
Pre-visit discussion using our clinic review tool. No additional management support is needed unless otherwise documented below in the visit note.  

## 2014-01-18 ENCOUNTER — Encounter: Payer: Self-pay | Admitting: Internal Medicine

## 2014-01-18 NOTE — Assessment & Plan Note (Signed)
On sertraline.  Increased stress.  Have discussed psychiatry referral.  Follow.

## 2014-01-18 NOTE — Assessment & Plan Note (Signed)
Last hgb wnl.  

## 2014-01-18 NOTE — Assessment & Plan Note (Signed)
Found to have thrombocytopenia and leukopenia.   Being followed and [facility] up at [facility] for her liver.  Labs followed there.  W/up there.  Obtain records.  [facility] and [facility].

## 2014-01-18 NOTE — Assessment & Plan Note (Signed)
On levothyroxine.  Just adjusted the synthroid dose.  Needs a f/u tsh.  Had an increased amount of labs drawn.  Need to obtain results.  May need to adjust her synthroid more.

## 2014-01-18 NOTE — Progress Notes (Signed)
Subjective:    Patient ID: Diane Burnett, female    DOB: 1971/01/29, 43 y.o.   MRN: [id number]  HPI 43 year old female with past history of diabetes, depression and hypothyroidism who comes in today for a scheduled follow up.   Works in [employer] at [employer].  On metformin and glipizide for her diabetes.  Has not been watching what she eats.  Not exercising. Occasionally checks her sugars.  Elevated - 200-270.   Was followed at the [facility] for decreased platelet count and anemia (9-10/13).  Had increased menstrual bleeding.  Is s/p Novasure ablation.  Has fibroids and ovarian cysts.  Recent w/up included abnormal labs (thrombocytopenia, leukopenia and abnormal liver function tests).  Abnormal abdominal ultrasound.  She is now being followed at [facility].   ANA positive.  Seeing a liver specialist.  Found to have esophageal varices.  On a beta blocker now.  She is back on zoloft.  Increased stress.  Having increased stiffness in her hands, shoulders and knees, etc.  Taking gabapentin.  The burning sensation has improved since being on the gabapentin.  Only tolerates taking it at night.  Increased fatigue.     Past Medical History  Diagnosis Date  . Asthma   . Depression   . Diabetes mellitus without complication   . Hypothyroidism     Outpatient Encounter Prescriptions as of 01/15/2014  Medication Sig  . CINNAMON PO Take by mouth.  . gabapentin (NEURONTIN) 100 MG capsule Take 1 capsule (100 mg total) by mouth 3 (three) times daily.  Marland Kitchen glipiZIDE (GLUCOTROL XL) 5 MG 24 hr tablet Take 1 tablet (5 mg total) by mouth daily with breakfast.  . levothyroxine (SYNTHROID) 150 MCG tablet Take 1 tablet (150 mcg total) by mouth daily before breakfast.  . loratadine (CLARITIN) 10 MG tablet Take 10 mg by mouth daily.  Marland Kitchen losartan (COZAAR) 25 MG tablet Take 25 mg by mouth daily.  . metFORMIN (GLUCOPHAGE) 1000 MG tablet Take 1 tablet (1,000 mg total) by mouth 2 (two) times daily with a meal.  . NADOLOL PO  Take by mouth at bedtime.  . sertraline (ZOLOFT) 100 MG tablet Take 100 mg by mouth daily.  . [DISCONTINUED] meloxicam (MOBIC) 15 MG tablet Take 15 mg by mouth daily.    Review of Systems Patient denies any headache, lightheadedness or dizziness reported.   No sinus or allergy symptoms.  No chest pain, tightness or palpitations reported.   No increased shortness of breath, cough or congestion.  No nausea or vomiting.  No acid reflux. Some abdominal discomfort as outlined.   No bowel change reported.   No urine change.  Has a known enlarged thyroid.  Increased joint stiffness.  Leukopenia and thrombocytopenia and abnormal liver function tests.  Undergoing w/up at [facility].  Going to [facility].  Not watching her diet.  Not exercising.  Not checking sugars regularly.  Unable to obtain labs secondary to her financial situation.       Objective:   Physical Exam  Filed Vitals:   01/15/14 1610  BP: 110/60  Pulse: 75  Temp: 98.1 F (91.63 C)   43 year old female in no acute distress.   HEENT:  Nares- clear.  Oropharynx - without lesions. NECK:  Supple.  Non tender.   No audible bruit.     HEART:  Appears to be regular. LUNGS:  No crackles or wheezing audible.  Respirations even and unlabored.  RADIAL PULSE:  Equal bilaterally.   ABDOMEN:  Soft.  Minimal tenderness to palpation.   Bowel sounds present and normal.  No audible abdominal bruit.   Enlarged spleen.   EXTREMITIES:  No increased edema present.  DP pulses palpable and equal bilaterally.   FEET:  No lesions.         Assessment & Plan:  HEALTH MAINTENANCE.  Will need to keep up to date with pelvic/paps and mammograms.    I spent more than 25 minutes with the patient and more than 50% of the time was spent in consultation regarding the above.

## 2014-01-18 NOTE — Assessment & Plan Note (Signed)
Joint pains as outlined.  W/up underway.  Obtain records.  ANA positive.  Was supposed to be referred to rheumatology.  Never received an appt.  Agreed to hematology referral now.  Follow.

## 2014-01-18 NOTE — Assessment & Plan Note (Signed)
On metformin and glipizide.   Discussed need for diet and exercise.  Needs eye exam.  States has never had one.  On losartan for renal protection.  Has not had an a1c.  Plans to get this drawn at the hospital.  Discussed with her regarding the importance of checking sugars, diet and exercise.  Non compliant.

## 2014-01-18 NOTE — Assessment & Plan Note (Signed)
Previously smoked.  Quit 2012.  Breathing stable.

## 2014-01-19 ENCOUNTER — Encounter: Payer: Self-pay | Admitting: Internal Medicine

## 2014-01-20 ENCOUNTER — Ambulatory Visit: Payer: Self-pay | Admitting: Hematology and Oncology

## 2014-01-27 ENCOUNTER — Telehealth: Payer: Self-pay | Admitting: Emergency Medicine

## 2014-01-27 NOTE — Telephone Encounter (Signed)
LVM for patient to call our office. Pt no showed for her visit with [facility] today. She was left a message with all apt information on her cell phone on 01/19/14 at 4:25 p.m.

## 2014-01-27 NOTE — Telephone Encounter (Signed)
FYI

## 2014-01-27 NOTE — Telephone Encounter (Signed)
Noted  

## 2014-02-18 ENCOUNTER — Telehealth: Payer: Self-pay | Admitting: Internal Medicine

## 2014-02-18 NOTE — Telephone Encounter (Signed)
My chart message sent to pt for lab draw.

## 2014-03-18 ENCOUNTER — Encounter: Payer: Self-pay | Admitting: *Deleted

## 2014-03-18 ENCOUNTER — Ambulatory Visit (INDEPENDENT_AMBULATORY_CARE_PROVIDER_SITE_OTHER): Payer: 59 | Admitting: Adult Health

## 2014-03-18 ENCOUNTER — Encounter: Payer: Self-pay | Admitting: Adult Health

## 2014-03-18 ENCOUNTER — Telehealth: Payer: Self-pay | Admitting: Internal Medicine

## 2014-03-18 VITALS — BP 153/86 | HR 82 | Temp 98.4°F | Resp 14 | Wt 200.5 lb

## 2014-03-18 DIAGNOSIS — M79672 Pain in left foot: Secondary | ICD-10-CM

## 2014-03-18 DIAGNOSIS — M79609 Pain in unspecified limb: Secondary | ICD-10-CM

## 2014-03-18 DIAGNOSIS — M2550 Pain in unspecified joint: Secondary | ICD-10-CM

## 2014-03-18 NOTE — Progress Notes (Signed)
   Subjective:    Patient ID: Diane Burnett, female    DOB: 01/11/1971, 43 y.o.   MRN: [id number]  HPI  Patient is a pleasant 43 year old female who presents to clinic with left foot pain that began this morning. She reports that she got up out of bed and noticed discomfort when she placed her foot on the ground. She denies any injury. She does not think that she was bit by an insect. She reports a history of uncontrolled diabetes and neuropathy. She denies any swelling or redness of her foot. Patient is wearing flip-flops. Patient is experiencing pain radiating up her left leg.  Patient reports history of having elevated ANA. [facility]. Patient reports multiple joint pains. She reports family history of rheumatoid arthritis.  Past Medical History  Diagnosis Date  . Asthma   . Depression   . Diabetes mellitus without complication   . Hypothyroidism     Review of Systems  Musculoskeletal: Positive for arthralgias and gait problem (limping). Negative for joint swelling.  All other systems reviewed and are negative.      Objective:   Physical Exam  Constitutional: She is oriented to person, place, and time. No distress.  HENT:  Head: Normocephalic and atraumatic.  Eyes: Conjunctivae and EOM are normal.  Neck: Normal range of motion. Neck supple.  Cardiovascular: Normal rate and regular rhythm.  Exam reveals no friction rub.   Pulmonary/Chest: Effort normal. No respiratory distress.  Musculoskeletal: Normal range of motion. She exhibits tenderness (arch of left foot). She exhibits no edema.  Left foot without any swelling, erythema. No broken skin.  Neurological: She is alert and oriented to person, place, and time.  Skin: Skin is warm and dry. No rash noted. No erythema.  Psychiatric: She has a normal mood and affect. Her behavior is normal. Judgment and thought content normal.      Assessment & Plan:   1. Joint pain Pt reports [facility]. Reports  family hx of RA. Check labs. - Sedimentation rate - Rheumatoid factor  2. Left foot pain ?stress fracture vs misstep vs early gout causing her foot pain. There is no swelling, redness of sign of any trauma to the foot. Check uric acid levels and have xray done. - Uric acid - DG Foot Complete Left; Future

## 2014-03-19 ENCOUNTER — Other Ambulatory Visit: Payer: Self-pay | Admitting: Internal Medicine

## 2014-03-19 ENCOUNTER — Encounter: Payer: Self-pay | Admitting: Adult Health

## 2014-03-19 LAB — SEDIMENTATION RATE: Sed Rate: 23 mm/hr — ABNORMAL HIGH (ref 0–22)

## 2014-03-19 LAB — URIC ACID: Uric Acid, Serum: 4.2 mg/dL (ref 2.4–7.0)

## 2014-03-19 LAB — RHEUMATOID FACTOR

## 2014-03-20 ENCOUNTER — Ambulatory Visit (INDEPENDENT_AMBULATORY_CARE_PROVIDER_SITE_OTHER)
Admission: RE | Admit: 2014-03-20 | Discharge: 2014-03-20 | Disposition: A | Discharge status: Home / Self Care | Payer: 59 | Source: Ambulatory Visit | Attending: Adult Health | Admitting: Adult Health

## 2014-03-20 ENCOUNTER — Telehealth: Payer: Self-pay | Admitting: Internal Medicine

## 2014-03-20 ENCOUNTER — Encounter: Payer: Self-pay | Admitting: Adult Health

## 2014-03-20 DIAGNOSIS — M79672 Pain in left foot: Secondary | ICD-10-CM

## 2014-03-20 DIAGNOSIS — M79609 Pain in unspecified limb: Secondary | ICD-10-CM

## 2014-03-20 NOTE — Telephone Encounter (Signed)
The patient has to work this weekend and she can hardly walk needing results from the xray that was [facility]. Please call patient today to inform her what she needs to do next.

## 2014-03-22 ENCOUNTER — Other Ambulatory Visit: Payer: Self-pay | Admitting: Adult Health

## 2014-03-22 NOTE — Telephone Encounter (Signed)
Results were sent to patient when resulted on 03/20/14. Note, pt was seen on 03/18/14 at which time she was sent for an xray. She did not have xray done until 03/20/14. Xray shows spur in calcaneous which is not where she was complaining of pain. Refer to podiatry

## 2014-03-23 ENCOUNTER — Other Ambulatory Visit: Payer: Self-pay | Admitting: Adult Health

## 2014-03-23 DIAGNOSIS — M79672 Pain in left foot: Secondary | ICD-10-CM

## 2014-03-27 ENCOUNTER — Other Ambulatory Visit: Payer: Self-pay | Admitting: Internal Medicine

## 2014-04-01 NOTE — Telephone Encounter (Signed)
Opened in error

## 2014-04-02 ENCOUNTER — Ambulatory Visit: Payer: Self-pay | Admitting: Podiatrist

## 2014-04-22 ENCOUNTER — Other Ambulatory Visit: Payer: Self-pay | Admitting: *Deleted

## 2014-04-22 MED ORDER — LOSARTAN POTASSIUM 25 MG PO TABS: 25.0000 mg | ORAL_TABLET | Freq: Every day | ORAL | Status: AC

## 2014-05-19 ENCOUNTER — Telehealth: Payer: Self-pay | Admitting: *Deleted

## 2014-05-19 NOTE — Telephone Encounter (Signed)
Chart reviewed for diabetic bundle. Pt last seen by Dr. Lorin PicketScott 01/15/14 for follow up. Was to return in 2 months for follow up, no appt has been scheduled. Sent mychart message on need to schedule an appointment. No LDL or a1c documented, sent mychart to see if she has had these drawn elsewhere.

## 2014-05-21 NOTE — Telephone Encounter (Signed)
Mailed unread message to pt  

## 2014-06-12 ENCOUNTER — Encounter: Payer: Self-pay | Admitting: *Deleted

## 2015-03-13 IMAGING — CR DG FOOT COMPLETE 3+V*L*
3 series · 3 of 3 positions shown · non-contrast
Comparison: None.

CLINICAL DATA: Left foot pain, no known injury

EXAM:
LEFT FOOT - COMPLETE 3+ VIEW

[view not recorded (1 of 3)]
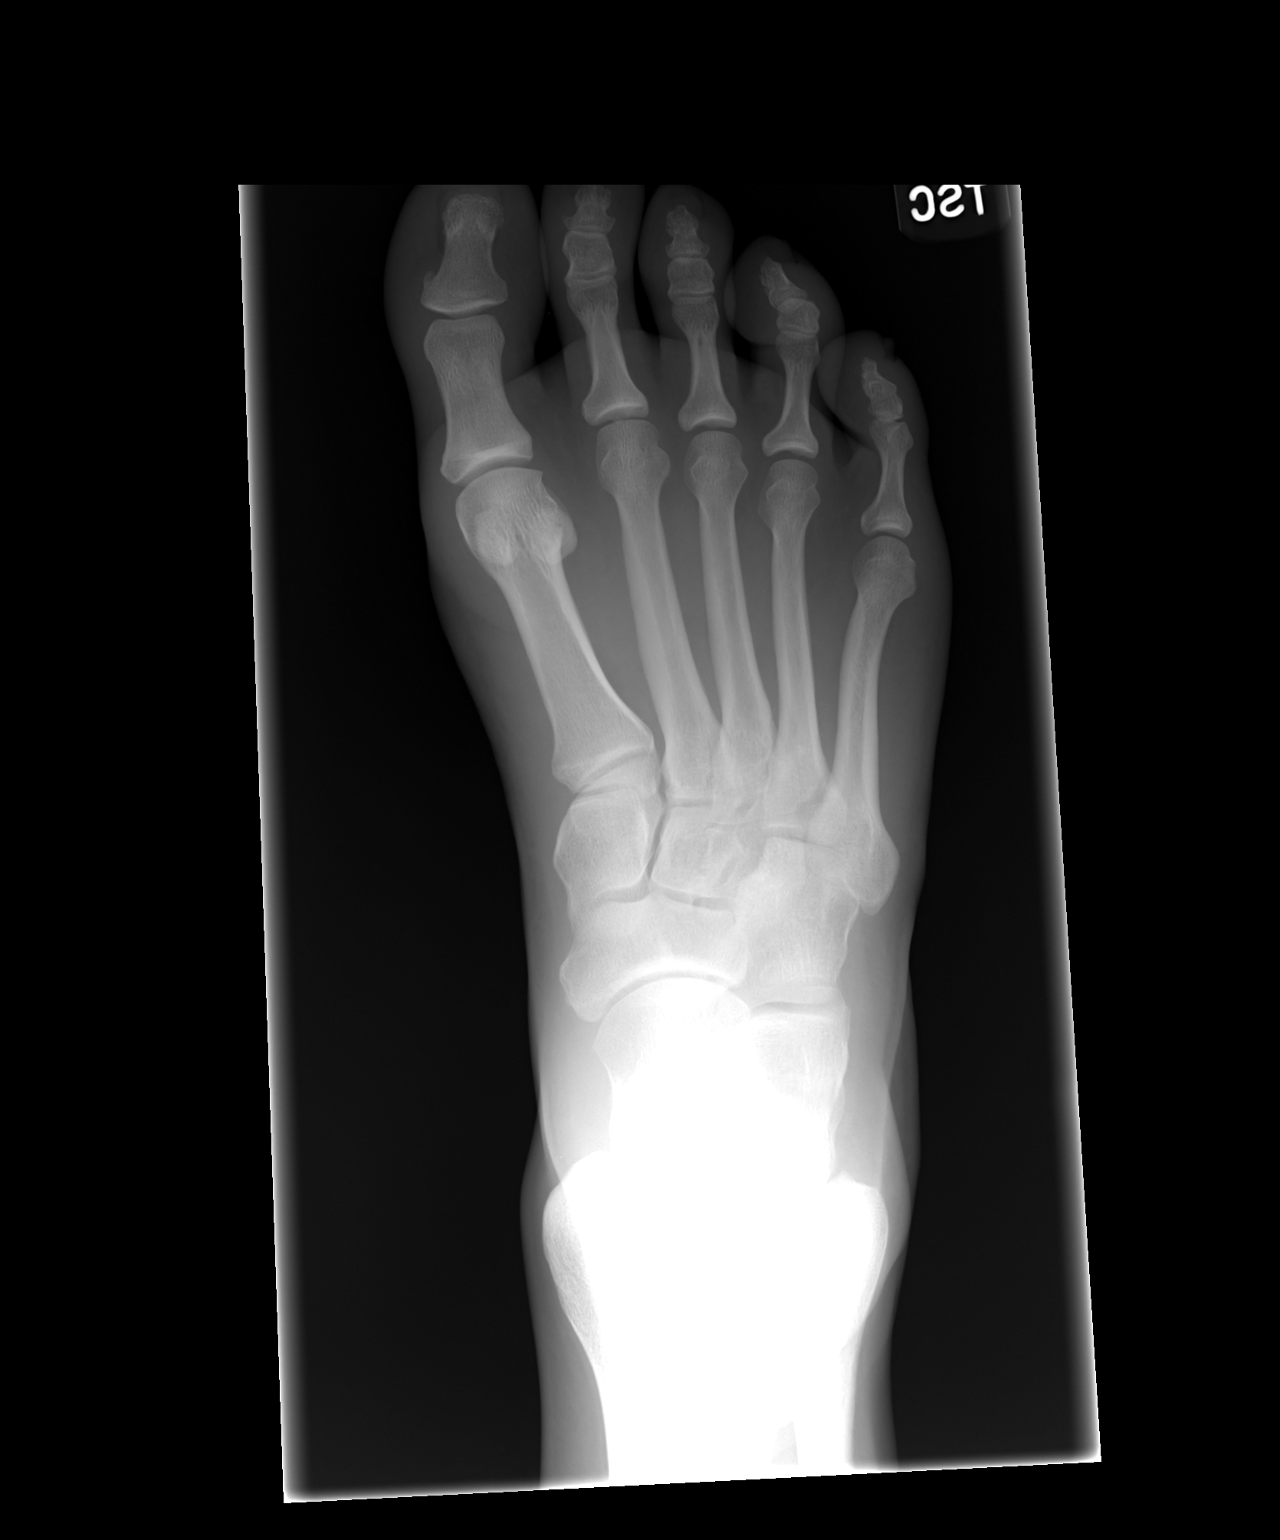

[view not recorded (2 of 3)]
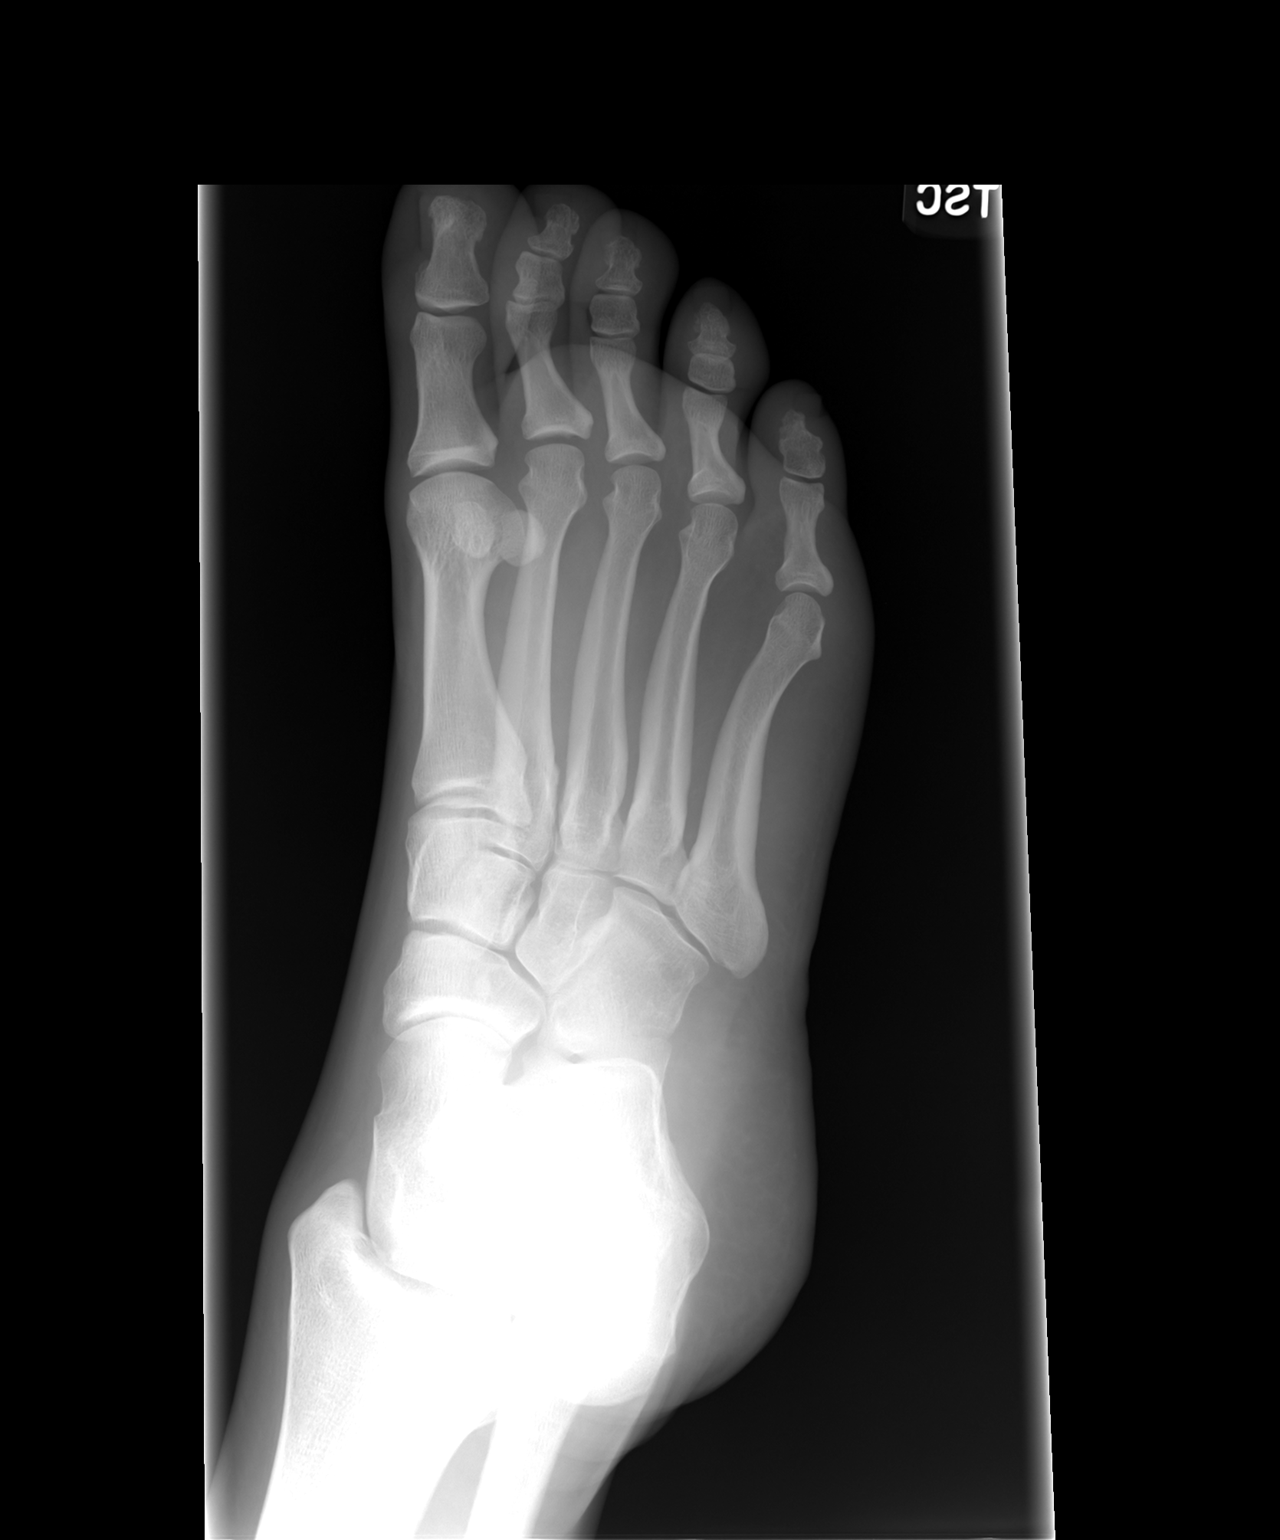

[view not recorded (3 of 3)]
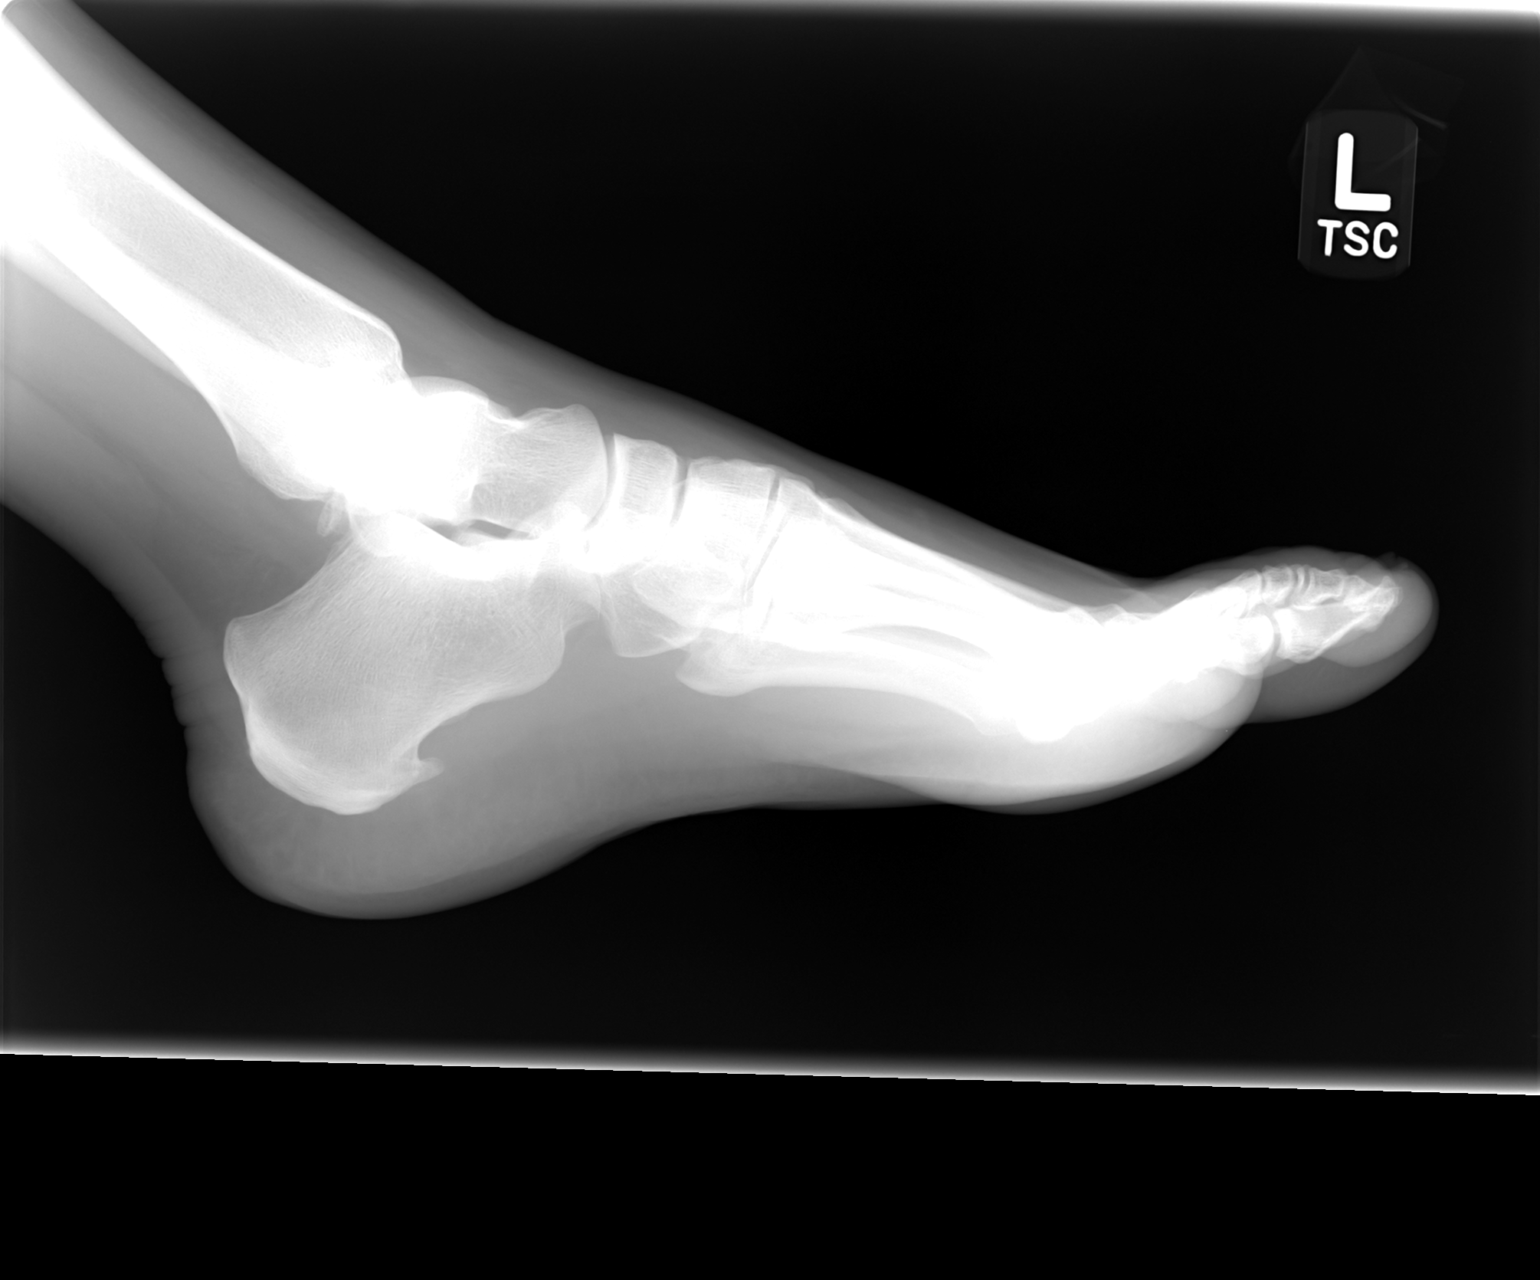

[3 of 3 positions shown; findings below may reference images not displayed]

FINDINGS: Three views of the left foot submitted. No acute fracture or
subluxation. There is plantar spur of calcaneus.
IMPRESSION: No acute fracture or subluxation.  Plantar spur of calcaneus.

## 2015-05-03 ENCOUNTER — Other Ambulatory Visit: Payer: Self-pay

## 2017-02-12 ENCOUNTER — Other Ambulatory Visit: Payer: Self-pay | Admitting: Thoracic Surgery

## 2017-02-12 ENCOUNTER — Ambulatory Visit
Admission: RE | Admit: 2017-02-12 | Discharge: 2017-02-12 | Disposition: A | Discharge status: Home / Self Care | Payer: Disability Insurance | Source: Ambulatory Visit | Attending: Thoracic Surgery | Admitting: Thoracic Surgery

## 2017-02-12 DIAGNOSIS — M545 Low back pain: Secondary | ICD-10-CM

## 2017-02-12 DIAGNOSIS — M5136 Other intervertebral disc degeneration, lumbar region: Secondary | ICD-10-CM | POA: Diagnosis not present

## 2018-02-05 IMAGING — CR DG LUMBAR SPINE 2-3V
1 series · 3 of 3 positions shown · non-contrast
Comparison: None.

CLINICAL DATA: 45-year-old female with back pain, weakness,
bilateral radiating pain.

EXAM:
LUMBAR SPINE - 2-3 VIEW

[Series 1: dg lumbar spine 2-3 views · 0.14mm/px · 3 of 3 slices shown]
[im 1/3]
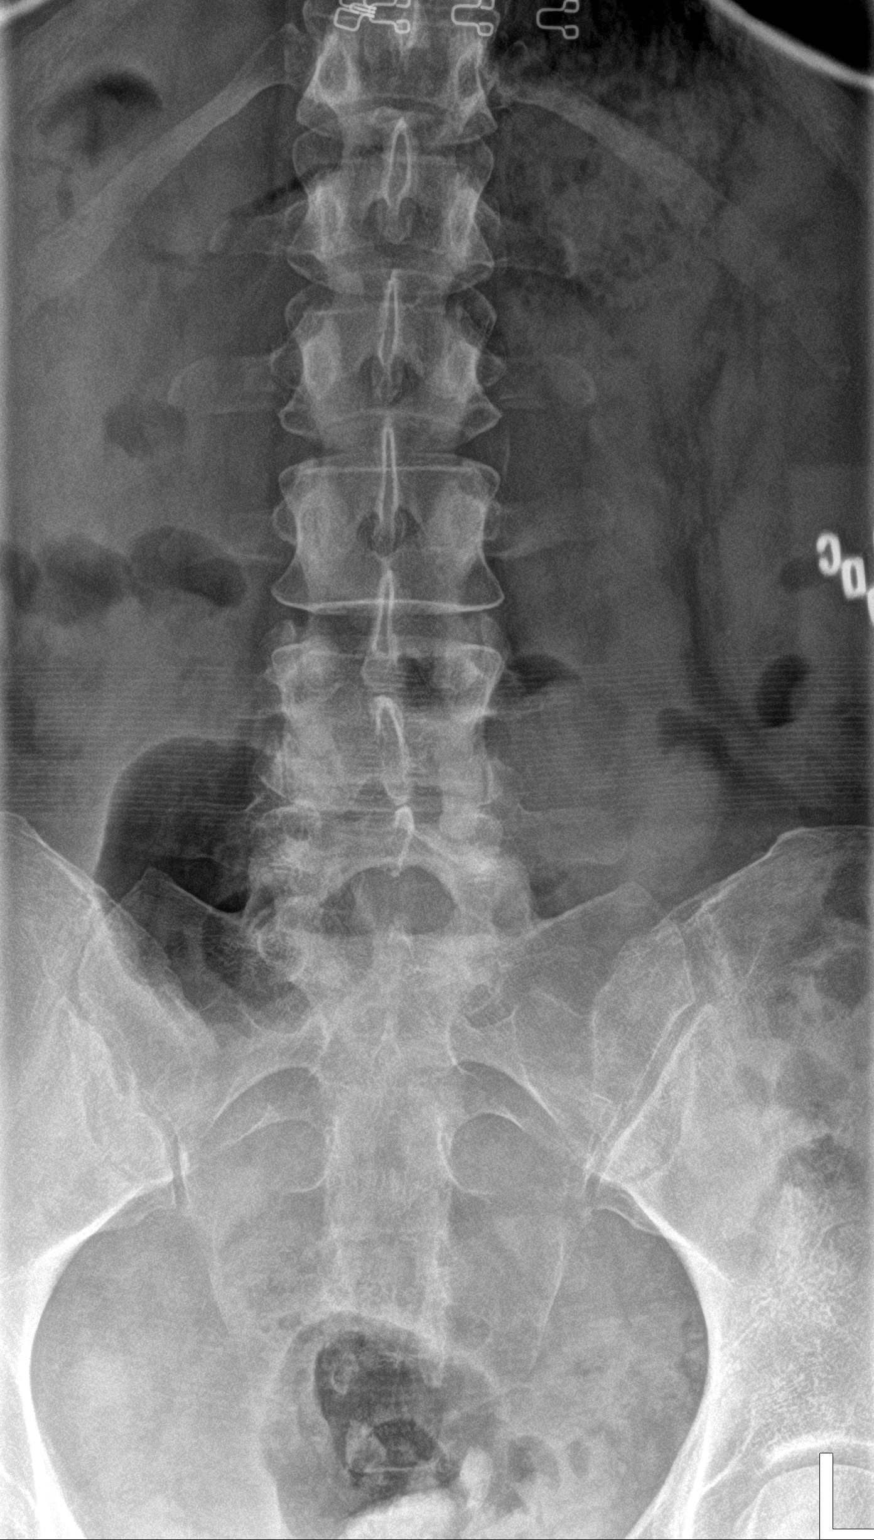
[im 2/3]
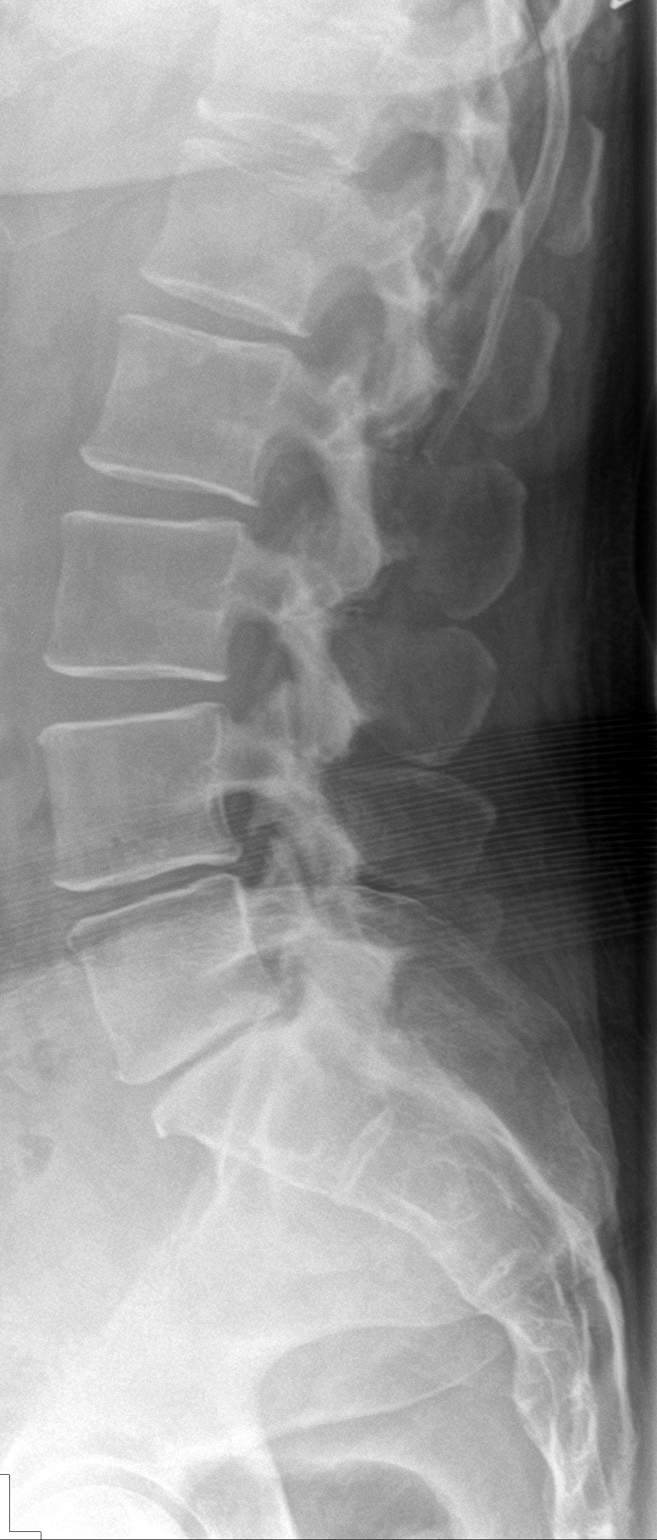
[im 3/3]
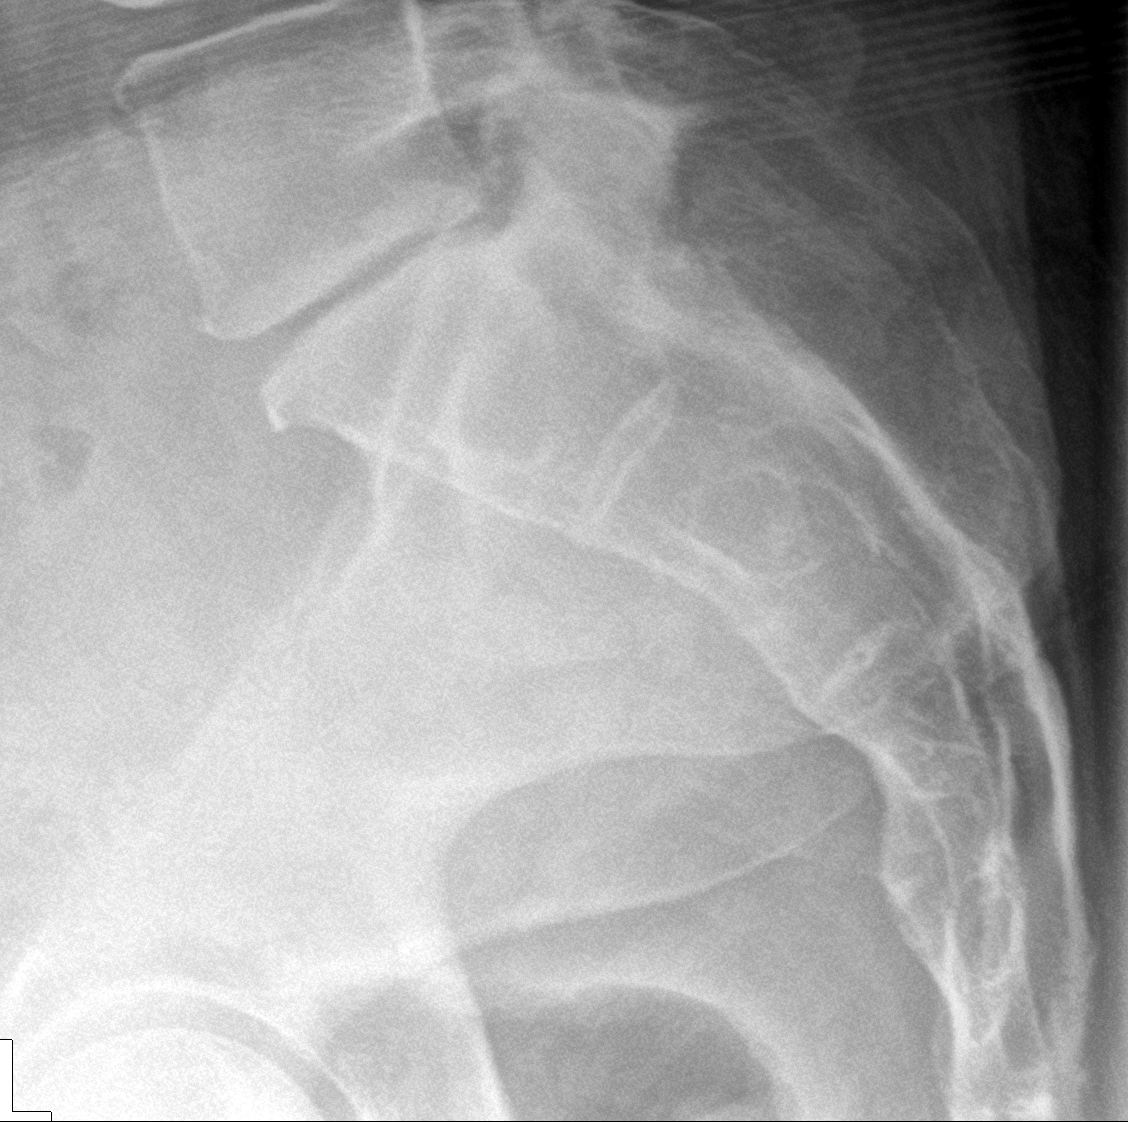

[3 of 3 positions shown; findings below may reference images not displayed]

FINDINGS: Bone mineralization is within normal limits. Normal lumbar
segmentation. Normal vertebral height and alignment. Moderate lower
lumbar disc space loss including at L4-L5, and up to severe disc
space loss at L5-S1. Mild endplate spurring. Mild facet hypertrophy
at those levels. No acute osseous abnormality identified. Visible
sacrum and SI joints are normal.
IMPRESSION: No acute osseous abnormality. Lumbar disc degeneration at L4-L5 and
L5-S1.

## 2020-11-30 ENCOUNTER — Ambulatory Visit (INDEPENDENT_AMBULATORY_CARE_PROVIDER_SITE_OTHER): Payer: 59 | Admitting: Primary Care

## 2022-04-06 DEATH — deceased
